# Patient Record
Sex: Male | Born: 1960 | Race: Black or African American | Hispanic: No | Marital: Married | Smoking: Current every day smoker
Health system: Southern US, Community
[De-identification: ages and names within clinical notes are randomized; demographics above are authoritative.]

---

## 2009-10-02 ENCOUNTER — Emergency Department (HOSPITAL_COMMUNITY): Admission: EM | Admit: 2009-10-02 | Discharge: 2009-10-02 | Payer: Self-pay | Admitting: *Deleted

## 2010-03-27 ENCOUNTER — Emergency Department (HOSPITAL_COMMUNITY): Admission: EM | Admit: 2010-03-27 | Discharge: 2010-03-27 | Payer: Self-pay | Admitting: Family Medicine

## 2011-05-18 IMAGING — CR DG FOOT COMPLETE 3+V*L*
3 series · 3 of 3 positions shown · non-contrast
Comparison: None.

CLINICAL DATA: Pain and laceration to the dorsal aspect of the
foot.

LEFT FOOT - COMPLETE 3+ VIEW

[view not recorded (1 of 3)]
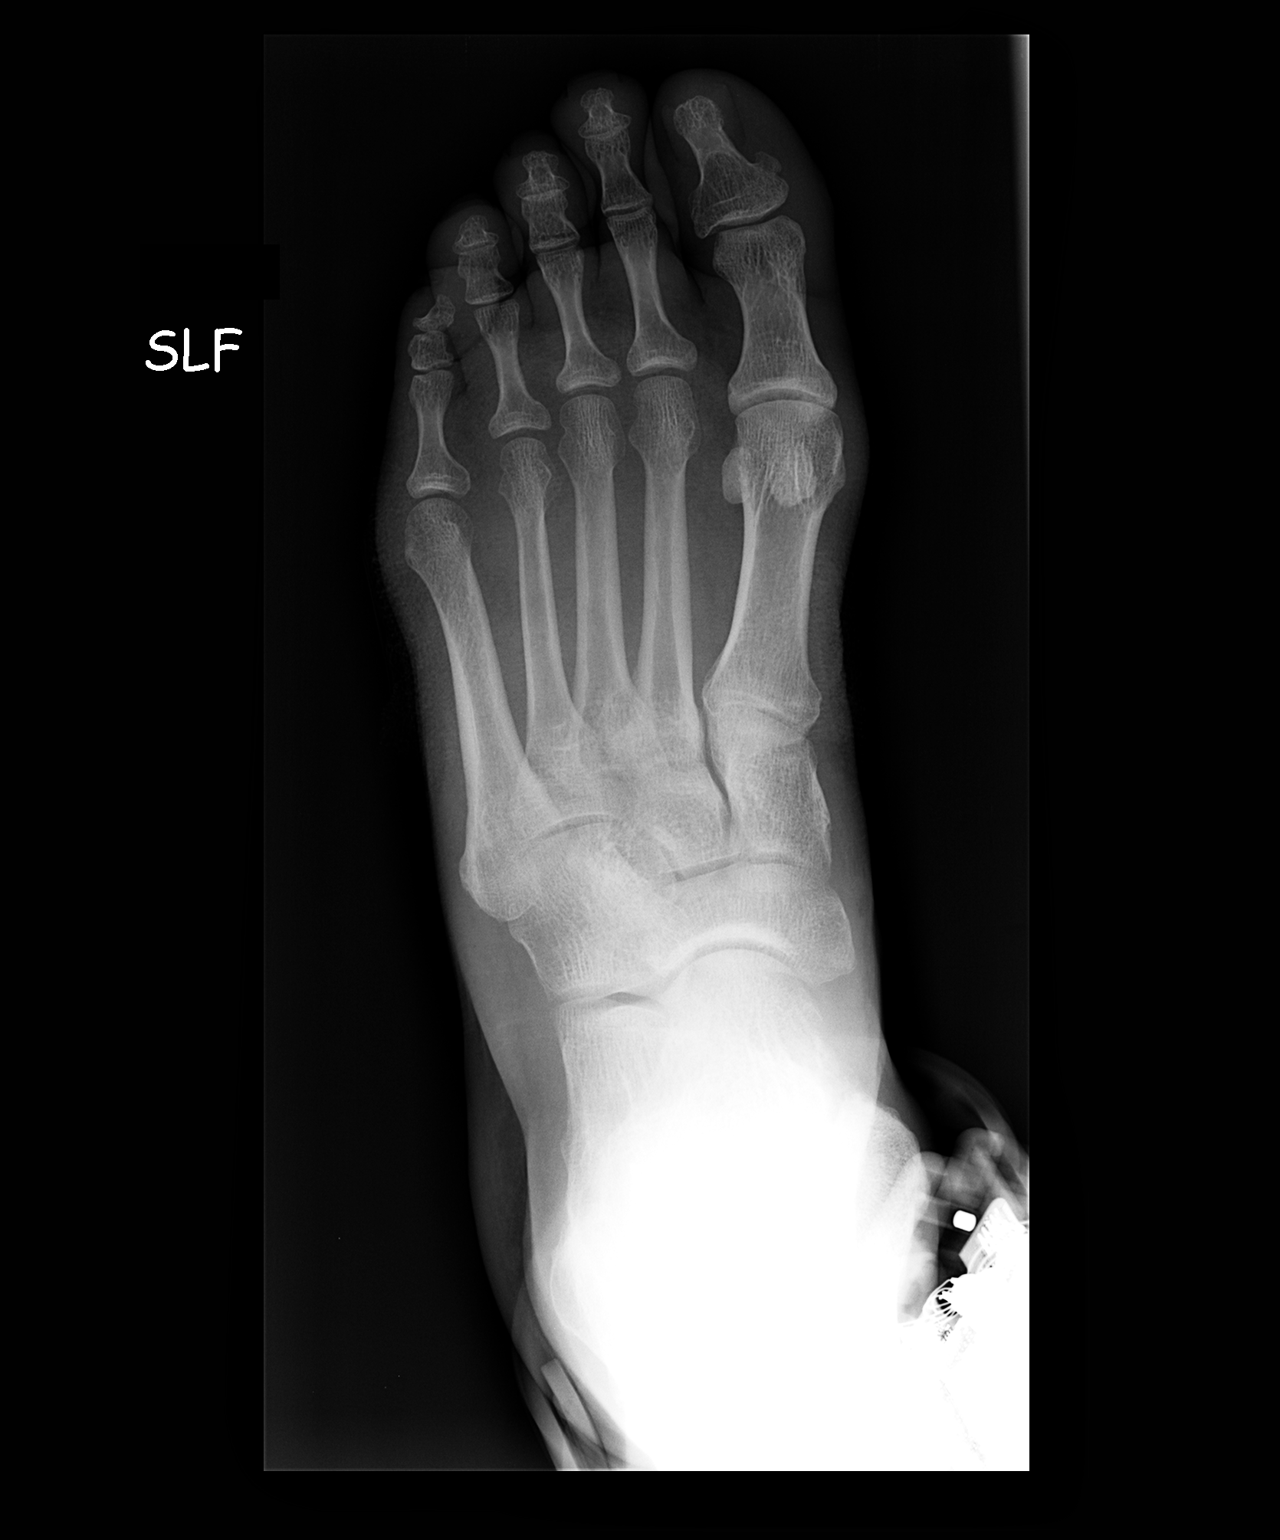

[view not recorded (2 of 3)]
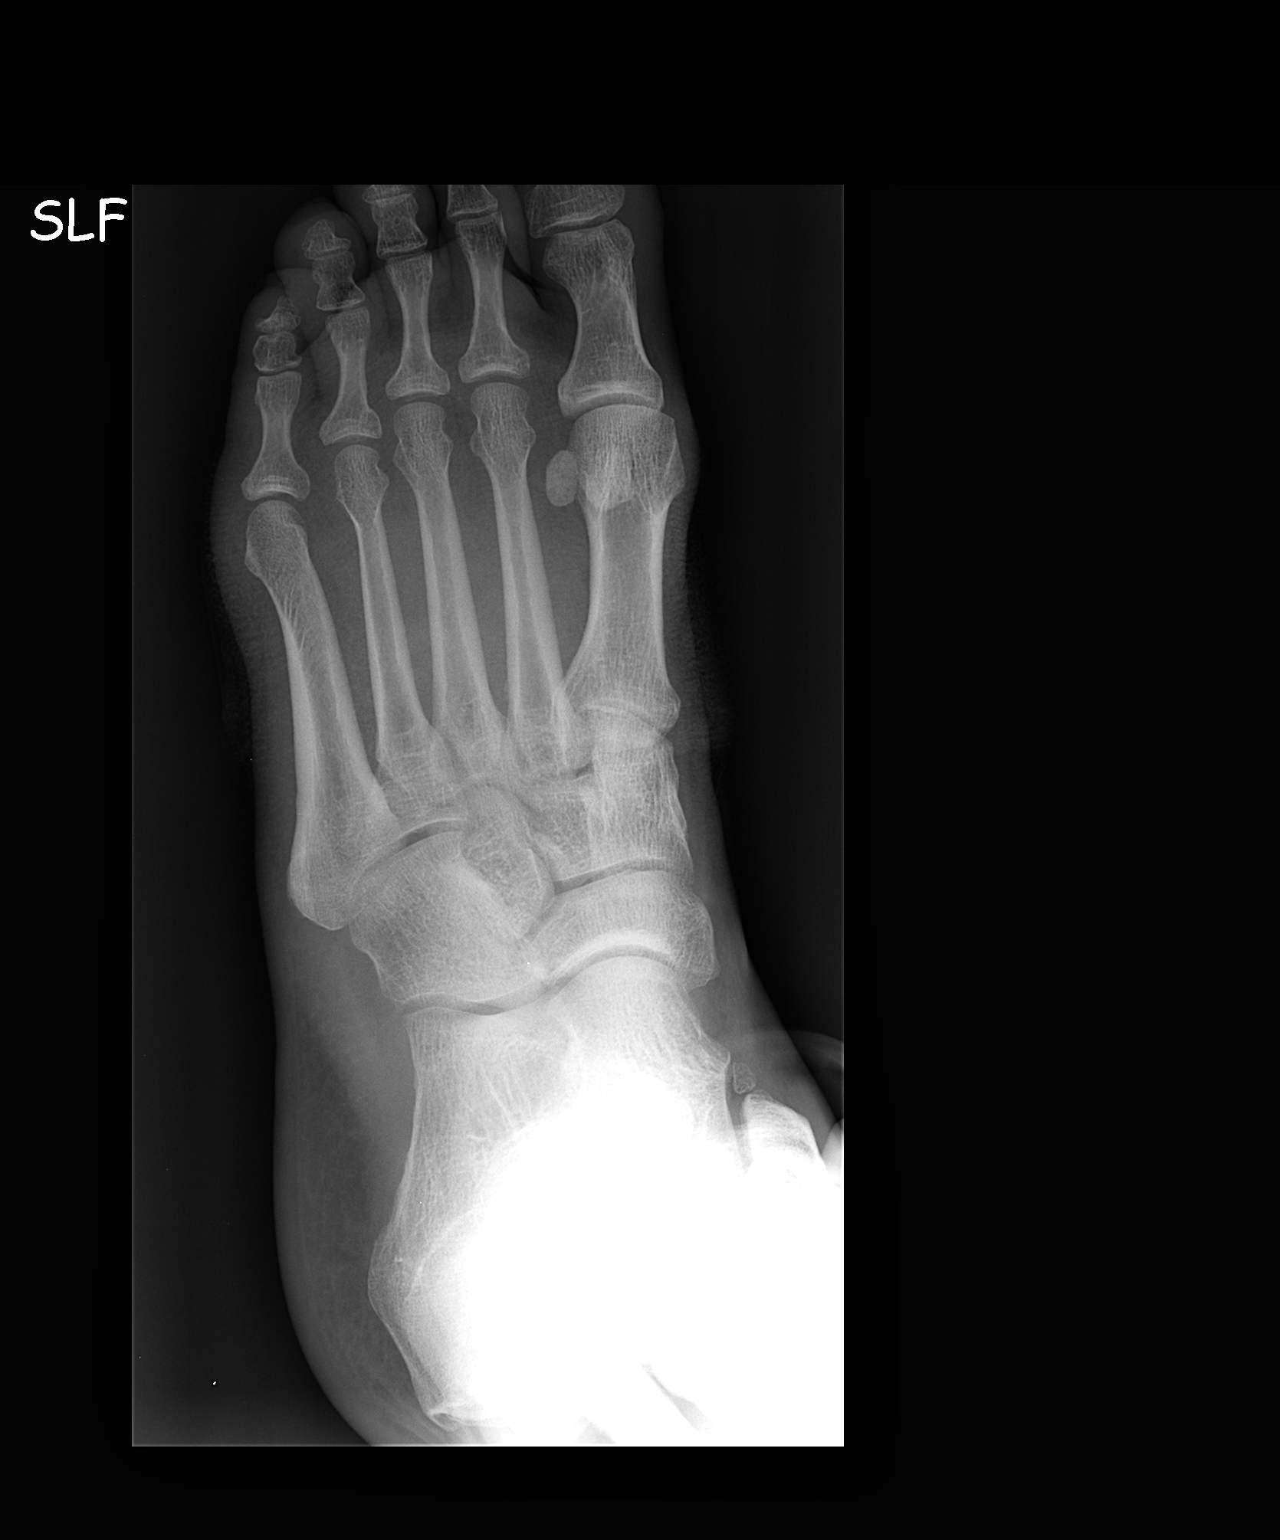

[view not recorded (3 of 3)]
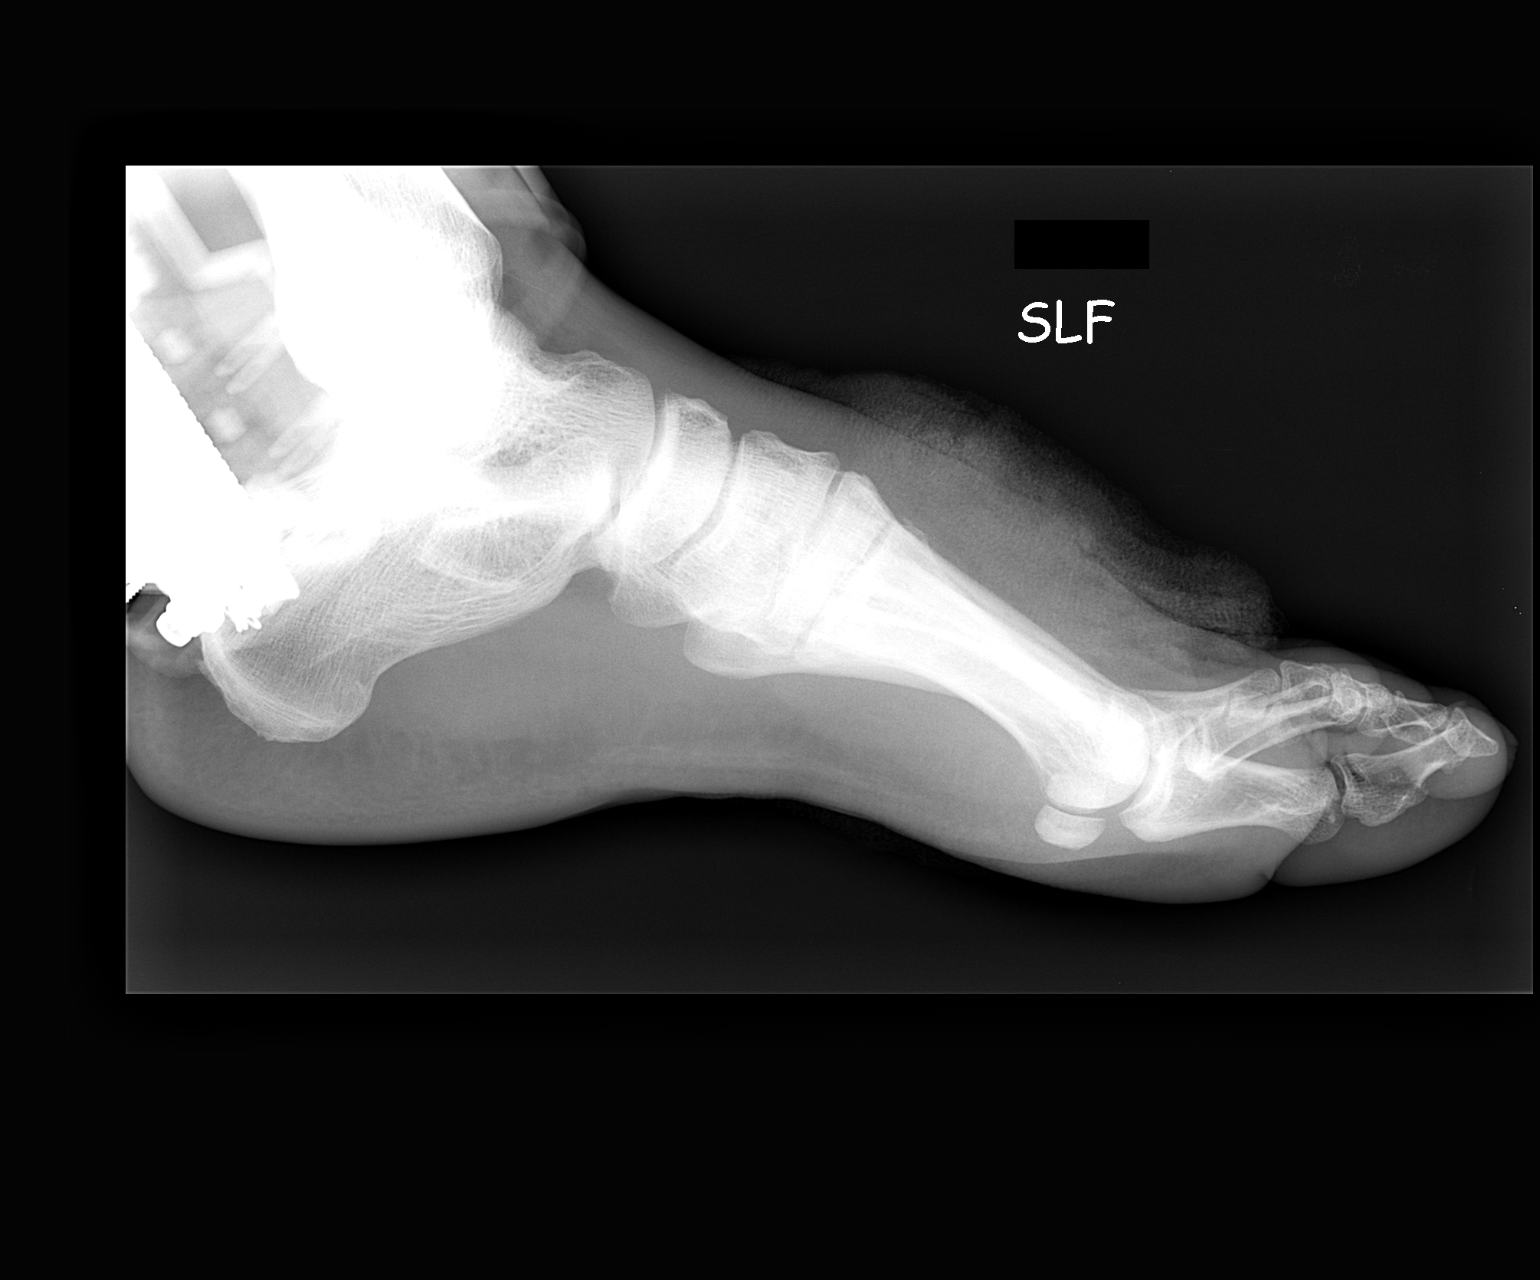

[3 of 3 positions shown; findings below may reference images not displayed]

FINDINGS: There is a tiny radiodensity at the dorsal aspect of the
tarsometatarsal joints which could represent a tiny avulsion of
bone or a tiny foreign body.  The study is otherwise normal.
IMPRESSION: Tiny bony avulsion or a tiny foreign body at the site of the dorsal
foot laceration.

## 2017-11-02 ENCOUNTER — Ambulatory Visit (HOSPITAL_COMMUNITY)
Admission: EM | Admit: 2017-11-02 | Discharge: 2017-11-02 | Disposition: A | Discharge status: Home / Self Care | Payer: Self-pay | Attending: Internal Medicine | Admitting: Internal Medicine

## 2017-11-02 ENCOUNTER — Other Ambulatory Visit: Payer: Self-pay

## 2017-11-02 ENCOUNTER — Encounter (HOSPITAL_COMMUNITY): Payer: Self-pay | Admitting: Emergency Medicine

## 2017-11-02 DIAGNOSIS — K0889 Other specified disorders of teeth and supporting structures: Secondary | ICD-10-CM

## 2017-11-02 MED ORDER — AMOXICILLIN-POT CLAVULANATE 875-125 MG PO TABS: 1.0000 | ORAL_TABLET | Freq: Two times a day (BID) | ORAL | 0 refills | Status: AC

## 2017-11-02 MED ORDER — HYDROCODONE-ACETAMINOPHEN 5-325 MG PO TABS: 1.0000 | ORAL_TABLET | Freq: Four times a day (QID) | ORAL | 0 refills | Status: AC | PRN

## 2017-11-02 NOTE — Discharge Instructions (Signed)
Please use dental resource to contact offices to seek permenant treatment/relief.  ° °Today we have given you an antibiotic- Augmentin. This should help with pain as any infection is cleared.  ° °For pain please take 600mg-800mg of Ibuprofen every 8 hours, take with 1000 mg of Tylenol Extra strength every 8 hours. These are safe to take together. Please take with food.  ° °I have also provided 3 days worth of stronger pain medication. This should only be used for severe pain. Do not drive or operate machinery while taking this medication.  ° °Please return if you start to experience significant swelling of your face, experiencing fever. °

## 2017-11-02 NOTE — ED Triage Notes (Signed)
Pt reports right lower dental pain that started on Friday.

## 2017-11-02 NOTE — ED Provider Notes (Signed)
[facility]    CSN: [id number] Arrival date & time: 11/02/17  1217     History   Chief Complaint Chief Complaint  Patient presents with  . Dental Pain    HPI Ryan Reilly is a 57 y.o. male presenting today for evaluation of dental pain.  States that he has had pain to his right lower jaw beginning last Friday.  Symptoms worst on Sunday.  He was doing salt water gargles with salt and vinegar that were helping until Sunday.  Is been taking 2 ibuprofen's with minimal relief.  Patient has orange card, does not have dental insurance.  Has felt mild swelling around the tooth.  HPI  History reviewed. No pertinent past medical history.  There are no active problems to display for this patient.   History reviewed. No pertinent surgical history.     Home Medications    Prior to Admission medications   Medication Sig Start Date End Date Taking? Authorizing Provider  amoxicillin-clavulanate (AUGMENTIN) 875-125 MG tablet Take 1 tablet by mouth every 12 (twelve) hours for 7 days. 11/02/17 11/09/17  Larissa Pegg C, PA-C  HYDROcodone-acetaminophen (NORCO/VICODIN) 5-325 MG tablet Take 1 tablet by mouth every 6 (six) hours as needed for up to 3 days. 11/02/17 11/05/17  Blayre Papania, Junius Creamer, PA-C    Family History History reviewed. No pertinent family history.  Social History Social History   Tobacco Use  . Smoking status: Current Every Day Smoker    Packs/day: 0.25    Types: Cigarettes  . Smokeless tobacco: Never Used  Substance Use Topics  . Alcohol use: Yes    Comment: occasional  . Drug use: Yes    Frequency: 7.0 times per week    Types: Marijuana     Allergies   Patient has no known allergies.   Review of Systems Review of Systems  Constitutional: Negative for activity change, appetite change, fatigue and fever.  HENT: Positive for dental problem and rhinorrhea. Negative for congestion, ear pain, postnasal drip, sinus pressure, sore throat and trouble  swallowing.   Eyes: Negative for pain and itching.  Respiratory: Negative for cough and shortness of breath.   Cardiovascular: Negative for chest pain.  Gastrointestinal: Negative for abdominal pain, diarrhea, nausea and vomiting.  Musculoskeletal: Negative for myalgias.  Skin: Negative for rash.  Neurological: Negative for dizziness, light-headedness and headaches.     Physical Exam Triage Vital Signs ED Triage Vitals  Enc Vitals Group     BP 11/02/17 1345 (!) 171/75     Pulse Rate 11/02/17 1345 (!) 55     Resp --      Temp 11/02/17 1345 98.9 F (37.2 C)     Temp Source 11/02/17 1345 Oral     SpO2 11/02/17 1345 100 %     Weight --      Height --      Head Circumference --      Peak Flow --      Pain Score 11/02/17 1343 10     Pain Loc --      Pain Edu? --      Excl. in [place]? --    No data found.  Updated Vital Signs BP (!) 171/75 (BP Location: Left Arm)   Pulse (!) 55   Temp 98.9 F (37.2 C) (Oral)   SpO2 100%   Visual Acuity Right Eye Distance:   Left Eye Distance:   Bilateral Distance:    Right Eye Near:   Left Eye Near:  Bilateral Near:     Physical Exam  Constitutional: He appears well-developed and well-nourished.  HENT:  Head: Normocephalic and atraumatic.  Poor dentition, multiple missing teeth on right lower jaw, one remaining posterior molar, mild gingival swelling and tenderness around tooth.  Eyes: Conjunctivae are normal.  Neck: Neck supple.  Cardiovascular: Normal rate.  Pulmonary/Chest: Effort normal. No respiratory distress.  Breathing comfortably at rest  Musculoskeletal: He exhibits no edema.  Neurological: He is alert.  Skin: Skin is warm and dry.  Psychiatric: He has a normal mood and affect.  Nursing note and vitals reviewed.    UC Treatments / Results  Labs (all labs ordered are listed, but only abnormal results are displayed) Labs Reviewed - No data to display  EKG None  Radiology No results  found.  Procedures Procedures (including critical care time)  Medications Ordered in UC Medications - No data to display  Initial Impression / Assessment and Plan / UC Course  I have reviewed the triage vital signs and the nursing notes.  Pertinent labs & imaging results that were available during my care of the patient were reviewed by me and considered in my medical decision making (see chart for details).     Patient with dental pain, possible early infection/abscess.  Will start Augmentin as well as pain medicine recommendations below. Discussed strict return precautions. Patient verbalized understanding and is agreeable with plan.  Final Clinical Impressions(s) / UC Diagnoses   Final diagnoses:  Pain, dental     Discharge Instructions     Please use dental resource to contact offices to seek permenant treatment/relief.   Today we have given you an antibiotic-Augmentin. This should help with pain as any infection is cleared.   For pain please take -  of Ibuprofen every 8 hours, take with 1000 mg of Tylenol Extra strength every 8 hours. These are safe to take together. Please take with food.   I have also provided 3 days worth of stronger pain medication. This should only be used for severe pain. Do not drive or operate machinery while taking this medication.   Please return if you start to experience significant swelling of your face, experiencing fever.   ED Prescriptions    Medication Sig Dispense Auth. Provider   amoxicillin-clavulanate (AUGMENTIN) 875-125 MG tablet Take 1 tablet by mouth every 12 (twelve) hours for 7 days. 14 tablet Mostafa Yuan C, PA-C   HYDROcodone-acetaminophen (NORCO/VICODIN) 5-325 MG tablet Take 1 tablet by mouth every 6 (six) hours as needed for up to 3 days. 12 tablet Salia Cangemi, [place] C, PA-C     Controlled Substance Prescriptions [place] Controlled Substance Registry consulted? Yes, I have consulted the [place] Controlled Substances  Registry for this patient, and feel the risk/benefit ratio today is favorable for proceeding with this prescription for a controlled substance.   Aritzel Krusemark, [place] C, PA-C 11/02/17 1425

## 2022-09-21 ENCOUNTER — Encounter (HOSPITAL_COMMUNITY): Payer: Self-pay

## 2022-09-21 ENCOUNTER — Ambulatory Visit (HOSPITAL_COMMUNITY)
Admission: EM | Admit: 2022-09-21 | Discharge: 2022-09-21 | Disposition: A | Discharge status: Home / Self Care | Payer: Self-pay | Attending: Emergency Medicine | Admitting: Emergency Medicine

## 2022-09-21 DIAGNOSIS — M5432 Sciatica, left side: Secondary | ICD-10-CM

## 2022-09-21 DIAGNOSIS — M25562 Pain in left knee: Secondary | ICD-10-CM

## 2022-09-21 DIAGNOSIS — M25462 Effusion, left knee: Secondary | ICD-10-CM

## 2022-09-21 MED ORDER — DEXAMETHASONE SODIUM PHOSPHATE 10 MG/ML IJ SOLN
INTRAMUSCULAR | Status: AC
  Filled 2022-09-21: qty 1

## 2022-09-21 MED ORDER — DEXAMETHASONE SODIUM PHOSPHATE 10 MG/ML IJ SOLN
10.0000 mg | Freq: Once | INTRAMUSCULAR | Status: AC
  Administered 2022-09-21: 10 mg via INTRAMUSCULAR

## 2022-09-21 MED ORDER — PREDNISONE 10 MG (21) PO TBPK: ORAL_TABLET | Freq: Every day | ORAL | 0 refills | Status: AC

## 2022-09-21 NOTE — ED Triage Notes (Signed)
Patient states that he has left lower back x 2 weeks.  Patient reports that he has pain that starts at his left calf and goes up to the left hip x 1 week. Patient also reports swelling in his left knee.   Patient states he has been soaking in Epsom salt.

## 2022-09-21 NOTE — ED Provider Notes (Signed)
[facility]    CSN: [id number] Arrival date & time: 09/21/22  1102      History   Chief Complaint Chief Complaint  Patient presents with   Back Pain    HPI Ryan Reilly is a 62 y.o. male.    Patient presents to clinic for left hip pain that radiates down to his calf x2 weeks, pain is exacerbated with walking and standing.  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  He also reports left knee pain and swelling x 1 week. Denies trauma or falls. Has been doing Epsom salt baths at home and using IcyHot.  Denies incontinence, back pain, fever, recent falls or injury, or saddle anesthesia.  Denies history of hypertension or diabetes.  The history is provided by the patient.  Back Pain Associated symptoms: no abdominal pain, no chest pain, no dysuria and no headaches     History reviewed. No pertinent past medical history.  There are no problems to display for this patient.   History reviewed. No pertinent surgical history.     Home Medications    Prior to Admission medications   Medication Sig Start Date End Date Taking? Authorizing Provider  predniSONE (STERAPRED UNI-PAK 21 TAB) 10 MG (21) TBPK tablet Take by mouth daily. Take 6 tabs by mouth daily  for 2 days, then 5 tabs for 2 days, then 4 tabs for 2 days, then 3 tabs for 2 days, 2 tabs for 2 days, then 1 tab by mouth daily for 2 days 09/21/22  Yes Louretta Shorten, [place] N, FNP    Family History Family History  Problem Relation Age of Onset   Chronic Renal Failure Mother     Social History Social History   Tobacco Use   Smoking status: Every Day    Packs/day: .25    Types: Cigarettes   Smokeless tobacco: Never  Vaping Use   Vaping Use: Never used  Substance Use Topics   Alcohol use: Yes    Comment: occasional   Drug use: Yes    Frequency: 7.0 times per week    Types: Marijuana     Allergies   Patient has no known allergies.   Review of  Systems Review of Systems  Constitutional:  Negative for fatigue.  Respiratory:  Negative for shortness of breath.   Cardiovascular:  Negative for chest pain.  Gastrointestinal:  Negative for abdominal pain.  Genitourinary:  Negative for dysuria.  Musculoskeletal:  Positive for back pain, gait problem and joint swelling.  Neurological:  Negative for headaches.     Physical Exam Triage Vital Signs ED Triage Vitals  Enc Vitals Group     BP 09/21/22 1114 (!) 158/99     Pulse Rate 09/21/22 1114 79     Resp 09/21/22 1114 16     Temp 09/21/22 1114 97.7 F (36.5 C)     Temp Source 09/21/22 1114 Oral     SpO2 09/21/22 1114 97 %     Weight --      Height --      Head Circumference --      Peak Flow --      Pain Score 09/21/22 1116 7     Pain Loc --      Pain Edu? --      Excl. in [place]? --    No data found.  Updated Vital Signs BP (!) 158/99 (BP Location: Left Arm)   Pulse 79  Temp 97.7 F (36.5 C) (Oral)   Resp 16   SpO2 97%   Visual Acuity Right Eye Distance:   Left Eye Distance:   Bilateral Distance:    Right Eye Near:   Left Eye Near:    Bilateral Near:     Physical Exam Vitals and nursing note reviewed.  Constitutional:      Appearance: Normal appearance.  HENT:     Head: Normocephalic and atraumatic.  Cardiovascular:     Rate and Rhythm: Normal rate and regular rhythm.  Pulmonary:     Effort: Pulmonary effort is normal. No respiratory distress.  Musculoskeletal:        General: Swelling and tenderness present. No deformity or signs of injury. Normal range of motion.     Right lower leg: No edema.     Left lower leg: No edema.       Legs:     Comments: Left knee with lateral proximal swelling around patella.  Negative straight leg raise.  Strength and range of motion intact in bilateral lower extremities.  Spine without step-off or deformity.  Skin:    General: Skin is warm and dry.     Capillary Refill: Capillary refill takes less than 2 seconds.   Neurological:     Mental Status: He is alert and oriented to person, place, and time.  Psychiatric:        Behavior: Behavior is cooperative.      UC Treatments / Results  Labs (all labs ordered are listed, but only abnormal results are displayed) Labs Reviewed - No data to display  EKG   Radiology No results found.  Procedures Procedures (including critical care time)  Medications Ordered in UC Medications  dexamethasone (DECADRON) injection 10 mg (10 mg Intramuscular Given 09/21/22 1144)    Initial Impression / Assessment and Plan / UC Course  I have reviewed the triage vital signs and the nursing notes.  Pertinent labs & imaging results that were available during my care of the patient were reviewed by me and considered in my medical decision making (see chart for details).  Vitals and triage reviewed, patient is hemodynamically stable. Will withhold imaging due to lack of falls or trauma. Left knee with proximal swelling around patella, will provide bracing, suggested RICE method. Without red flag symptoms of saddle anesthesia, or loss of bowl or bladder function. Left sided lumbar pain that radiates down into calf suggestive sciatica, given IM steroid and taper to help with pain and inflammation.  Advised proper shoe support and rest.  Ortho follow-up if pain persist, emergency room and return precautions discussed, no questions at this time.    Final Clinical Impressions(s) / UC Diagnoses   Final diagnoses:  Sciatica of left side  Pain and swelling of knee, left     Discharge Instructions      We have provided you a knee sleeve for your knee pain and swelling.  Please rest, ice, compress and elevate.  You were leg pain is suggestive of sciatica, we have provided you with steroids in clinic to help reduce the inflammation.  Please rest, some stretches are provided.  If your pain persist, please follow-up with [facility].  Please seek immediate  care if you develop numbness, tingling, loss of bowel or bladder function, or worsening of pain.      ED Prescriptions     Medication Sig Dispense Auth. Provider   predniSONE (STERAPRED UNI-PAK 21 TAB) 10 MG (21) TBPK tablet Take  by mouth daily. Take 6 tabs by mouth daily  for 2 days, then 5 tabs for 2 days, then 4 tabs for 2 days, then 3 tabs for 2 days, 2 tabs for 2 days, then 1 tab by mouth daily for 2 days 42 tablet Louretta Shorten, [place] N, Ali Molina      I have reviewed the PDMP during this encounter.   Kristan Votta, [place] N, [place] 09/21/22 1153

## 2022-09-21 NOTE — Discharge Instructions (Addendum)
We have provided you a knee sleeve for your knee pain and swelling.  Please rest, ice, compress and elevate.  You were leg pain is suggestive of sciatica, we have provided you with steroids in clinic to help reduce the inflammation.  Please rest, some stretches are provided.  If your pain persist, please follow-up with [facility].  Please seek immediate care if you develop numbness, tingling, loss of bowel or bladder function, or worsening of pain.
